# Patient Record
Sex: Male | Born: 1985 | Race: Black or African American | Hispanic: No | Smoking: Never smoker
Health system: Southern US, Community
[De-identification: ages and names within clinical notes are randomized; demographics above are authoritative.]

---

## 2006-05-25 ENCOUNTER — Encounter: Admission: RE | Admit: 2006-05-25 | Discharge: 2006-05-25 | Payer: Self-pay | Admitting: General Practice

## 2006-05-31 ENCOUNTER — Encounter: Admission: RE | Admit: 2006-05-31 | Discharge: 2006-06-10 | Payer: Self-pay | Admitting: General Practice

## 2015-11-30 ENCOUNTER — Emergency Department (HOSPITAL_COMMUNITY)
Admission: EM | Admit: 2015-11-30 | Discharge: 2015-11-30 | Disposition: A | Discharge status: Home / Self Care | Payer: Self-pay | Attending: Emergency Medicine | Admitting: Emergency Medicine

## 2015-11-30 ENCOUNTER — Emergency Department (HOSPITAL_COMMUNITY): Payer: Self-pay

## 2015-11-30 ENCOUNTER — Encounter (HOSPITAL_COMMUNITY): Payer: Self-pay | Admitting: *Deleted

## 2015-11-30 DIAGNOSIS — R109 Unspecified abdominal pain: Secondary | ICD-10-CM | POA: Insufficient documentation

## 2015-11-30 LAB — URINALYSIS, ROUTINE W REFLEX MICROSCOPIC
Bilirubin Urine: NEGATIVE
Glucose, UA: NEGATIVE mg/dL
Hgb urine dipstick: NEGATIVE
KETONES UR: NEGATIVE mg/dL
NITRITE: NEGATIVE
PH: 7 (ref 5.0–8.0)
PROTEIN: NEGATIVE mg/dL
Specific Gravity, Urine: 1.025 (ref 1.005–1.030)

## 2015-11-30 LAB — CBC
HEMATOCRIT: 47.5 % (ref 39.0–52.0)
Hemoglobin: 16.3 g/dL (ref 13.0–17.0)
MCH: 29.8 pg (ref 26.0–34.0)
MCHC: 34.3 g/dL (ref 30.0–36.0)
MCV: 86.8 fL (ref 78.0–100.0)
PLATELETS: 299 10*3/uL (ref 150–400)
RBC: 5.47 MIL/uL (ref 4.22–5.81)
RDW: 13.6 % (ref 11.5–15.5)
WBC: 6.7 10*3/uL (ref 4.0–10.5)

## 2015-11-30 LAB — COMPREHENSIVE METABOLIC PANEL
ALT: 33 U/L (ref 17–63)
ANION GAP: 7 (ref 5–15)
AST: 25 U/L (ref 15–41)
Albumin: 4.6 g/dL (ref 3.5–5.0)
Alkaline Phosphatase: 59 U/L (ref 38–126)
BILIRUBIN TOTAL: 0.7 mg/dL (ref 0.3–1.2)
BUN: 9 mg/dL (ref 6–20)
CHLORIDE: 102 mmol/L (ref 101–111)
CO2: 30 mmol/L (ref 22–32)
Calcium: 9.7 mg/dL (ref 8.9–10.3)
Creatinine, Ser: 1.3 mg/dL — ABNORMAL HIGH (ref 0.61–1.24)
Glucose, Bld: 104 mg/dL — ABNORMAL HIGH (ref 65–99)
POTASSIUM: 4.2 mmol/L (ref 3.5–5.1)
Sodium: 139 mmol/L (ref 135–145)
TOTAL PROTEIN: 7.9 g/dL (ref 6.5–8.1)

## 2015-11-30 LAB — URINE MICROSCOPIC-ADD ON

## 2015-11-30 LAB — LIPASE, BLOOD: LIPASE: 22 U/L (ref 11–51)

## 2015-11-30 MED ORDER — NAPROXEN 500 MG PO TABS: 500.0000 mg | ORAL_TABLET | Freq: Two times a day (BID) | ORAL | 0 refills | Status: AC

## 2015-11-30 NOTE — ED Triage Notes (Signed)
Pt reports left side pain for extended amount of time. Reports pain is intermittent and that his "side feels warm to touch." denies n/v or urinary symptoms.

## 2015-11-30 NOTE — ED Notes (Signed)
Patient transported to X-ray 

## 2015-11-30 NOTE — ED Provider Notes (Signed)
[facility]EMERGENCY DEPT Provider Note   CSN: [id number] Arrival date & time: 11/30/15  [id number]     History   Chief Complaint Chief Complaint  Patient presents with  . Abdominal Pain    HPI Todd Adams is a 30 y.o. male.  HPI Todd Adams is a 30 y.o. male with no medical problems, presents to ED with complaint of left flank pain. Pain started 6 months ago. Pain is intermittent. Denies any associated symptoms. Denies nausea, vomiting, dysuria, hematuria. No changes in bowels. Pain comes and goes. States no pain at this time, but reports severe pain yesterday when trying to sleep. Pt is very active, plays soccer. States he did not get injured. Not taking any medications for this.   History reviewed. No pertinent past medical history.  There are no active problems to display for this patient.   History reviewed. No pertinent surgical history.     Home Medications    Prior to Admission medications   Not on File    Family History History reviewed. No pertinent family history.  Social History Social History  Substance Use Topics  . Smoking status: Never Smoker  . Smokeless tobacco: Not on file  . Alcohol use No     Allergies   Patient has no known allergies.   Review of Systems Review of Systems  Constitutional: Negative for chills and fever.  Respiratory: Negative for cough, chest tightness and shortness of breath.   Cardiovascular: Positive for chest pain. Negative for palpitations and leg swelling.  Gastrointestinal: Positive for abdominal pain. Negative for abdominal distention, diarrhea, nausea and vomiting.  Genitourinary: Positive for flank pain. Negative for dysuria, frequency, hematuria and urgency.  Musculoskeletal: Negative for arthralgias, myalgias, neck pain and neck stiffness.  Skin: Negative for rash.  Allergic/Immunologic: Negative for immunocompromised state.  Neurological: Negative for dizziness, weakness, light-headedness, numbness and headaches.    All other systems reviewed and are negative.    Physical Exam Updated Vital Signs BP 129/85   Pulse 80   Temp 98.2 F (36.8 C) (Oral)   Resp 14   SpO2 100%   Physical Exam  Constitutional: He appears well-developed and well-nourished. No distress.  HENT:  Head: Normocephalic and atraumatic.  Eyes: Conjunctivae are normal.  Neck: Neck supple.  Cardiovascular: Normal rate, regular rhythm and normal heart sounds.   Pulmonary/Chest: Effort normal. No respiratory distress. He has no wheezes. He has no rales.  Abdominal: Soft. Bowel sounds are normal. He exhibits no distension. There is no tenderness. There is no rebound.  No CVA tenderness  Musculoskeletal: He exhibits no edema.  Neurological: He is alert.  Skin: Skin is warm and dry.  Nursing note and vitals reviewed.    ED Treatments / Results  Labs (all labs ordered are listed, but only abnormal results are displayed) Labs Reviewed  COMPREHENSIVE METABOLIC PANEL - Abnormal; Notable for the following:       Result Value   Glucose, Bld 104 (*)    Creatinine, Ser 1.30 (*)    All other components within normal limits  URINALYSIS, ROUTINE W REFLEX MICROSCOPIC (NOT [facility]) - Abnormal; Notable for the following:    APPearance CLOUDY (*)    Leukocytes, UA SMALL (*)    All other components within normal limits  URINE MICROSCOPIC-ADD ON - Abnormal; Notable for the following:    Squamous Epithelial / LPF 0-5 (*)    Bacteria, UA RARE (*)    All other components within normal limits  LIPASE, BLOOD  CBC  EKG  EKG Interpretation None       Radiology Dg Abdomen Acute W/chest  Result Date: 11/30/2015 CLINICAL DATA:  Persistent left flank pain for 7 months. EXAM: DG ABDOMEN ACUTE W/ 1V CHEST COMPARISON:  05/25/2006 lumbar spine FINDINGS: There is no evidence of dilated bowel loops or free intraperitoneal air. No radiopaque calculi or other significant radiographic abnormality is seen. Heart size and mediastinal contours  are within normal limits. Both lungs are clear. IMPRESSION: Negative abdominal radiographs.  No acute cardiopulmonary disease. Electronically Signed   By: Judie PetitM.  Shick M.D.   On: 11/30/2015 09:59    Procedures Procedures (including critical care time)  Medications Ordered in ED Medications - No data to display   Initial Impression / Assessment and Plan / ED Course  I have reviewed the triage vital signs and the nursing notes.  Pertinent labs & imaging results that were available during my care of the patient were reviewed by me and considered in my medical decision making (see chart for details).  Clinical Course    Pt in Emergency dept with left flank pain, none at this time. Question musculoskeletal vs kidney stone vs rib injury. Will get xray, labs, UA.  10:21 AM Creat is 1.3, no prior. Will need recheck. UA with no definite infection, sample with some squamous epithelial cells, no WBCs, rare bacteria. Pt's symptoms for 3 months. Question radicular pain vs muscular pain. At this time, no indication for emergent imaging. Close outpatient follow up.   Vitals:   11/30/15 0839 11/30/15 0915  BP: 143/95 129/85  Pulse: 72 80  Resp: 14   Temp: 98.2 F (36.8 C)   TempSrc: Oral   SpO2: 100% 100%     Final Clinical Impressions(s) / ED Diagnoses   Final diagnoses:  Flank pain    New Prescriptions New Prescriptions   NAPROXEN (NAPROSYN) 500 MG TABLET    Take 1 tablet (500 mg total) by mouth 2 (two) times daily.     Jaynie Crumbleatyana Madlyn Crosby, PA-C 11/30/15 1024    Jacalyn LefevreJulie Haviland, MD 11/30/15 1202

## 2015-11-30 NOTE — Discharge Instructions (Signed)
Naprosyn for pain. Make sure to drink plenty of fluids. Follow up with a family doctor if continue to have pain. Return if worsening symptoms.

## 2016-02-24 ENCOUNTER — Emergency Department (HOSPITAL_COMMUNITY): Payer: No Typology Code available for payment source

## 2016-02-24 ENCOUNTER — Encounter (HOSPITAL_COMMUNITY): Payer: Self-pay | Admitting: Emergency Medicine

## 2016-02-24 ENCOUNTER — Emergency Department (HOSPITAL_COMMUNITY)
Admission: EM | Admit: 2016-02-24 | Discharge: 2016-02-24 | Disposition: A | Discharge status: Home / Self Care | Payer: No Typology Code available for payment source | Attending: Emergency Medicine | Admitting: Emergency Medicine

## 2016-02-24 DIAGNOSIS — S0990XA Unspecified injury of head, initial encounter: Secondary | ICD-10-CM | POA: Insufficient documentation

## 2016-02-24 DIAGNOSIS — S01511A Laceration without foreign body of lip, initial encounter: Secondary | ICD-10-CM | POA: Insufficient documentation

## 2016-02-24 DIAGNOSIS — Y999 Unspecified external cause status: Secondary | ICD-10-CM | POA: Insufficient documentation

## 2016-02-24 DIAGNOSIS — S8254XA Nondisplaced fracture of medial malleolus of right tibia, initial encounter for closed fracture: Secondary | ICD-10-CM | POA: Diagnosis not present

## 2016-02-24 DIAGNOSIS — Y92410 Unspecified street and highway as the place of occurrence of the external cause: Secondary | ICD-10-CM | POA: Diagnosis not present

## 2016-02-24 DIAGNOSIS — S80812A Abrasion, left lower leg, initial encounter: Secondary | ICD-10-CM | POA: Insufficient documentation

## 2016-02-24 DIAGNOSIS — Z23 Encounter for immunization: Secondary | ICD-10-CM | POA: Insufficient documentation

## 2016-02-24 DIAGNOSIS — Y9389 Activity, other specified: Secondary | ICD-10-CM | POA: Diagnosis not present

## 2016-02-24 DIAGNOSIS — S82841A Displaced bimalleolar fracture of right lower leg, initial encounter for closed fracture: Secondary | ICD-10-CM

## 2016-02-24 DIAGNOSIS — S99911A Unspecified injury of right ankle, initial encounter: Secondary | ICD-10-CM | POA: Diagnosis present

## 2016-02-24 MED ORDER — TETANUS-DIPHTH-ACELL PERTUSSIS 5-2.5-18.5 LF-MCG/0.5 IM SUSP
0.5000 mL | Freq: Once | INTRAMUSCULAR | Status: AC
  Administered 2016-02-24: 0.5 mL via INTRAMUSCULAR
  Filled 2016-02-24: qty 0.5

## 2016-02-24 MED ORDER — MORPHINE SULFATE (PF) 4 MG/ML IV SOLN
6.0000 mg | Freq: Once | INTRAVENOUS | Status: AC
  Administered 2016-02-24: 6 mg via INTRAMUSCULAR
  Filled 2016-02-24: qty 2

## 2016-02-24 MED ORDER — FENTANYL CITRATE (PF) 100 MCG/2ML IJ SOLN
50.0000 ug | Freq: Once | INTRAMUSCULAR | Status: DC
  Filled 2016-02-24: qty 2

## 2016-02-24 NOTE — ED Notes (Signed)
Patient transported to X-ray 
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
## 2018-11-27 ENCOUNTER — Other Ambulatory Visit: Payer: Self-pay

## 2018-11-27 DIAGNOSIS — Z20822 Contact with and (suspected) exposure to covid-19: Secondary | ICD-10-CM

## 2018-11-28 LAB — NOVEL CORONAVIRUS, NAA: SARS-CoV-2, NAA: NOT DETECTED
